# Patient Record
Sex: Female | Born: 2000 | Race: White | Hispanic: No | Marital: Single | Smoking: Never smoker
Health system: Southern US, Community
[De-identification: ages and names within clinical notes are randomized; demographics above are authoritative.]

---
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
## 2022-03-25 NOTE — Progress Notes (Deleted)
21 y.o. No obstetric history on file. Single Caucasian female here for annual exam.    PCP:     No LMP recorded.           Sexually active: {yes no:314532}  The current method of family planning is {contraception:315051}.    Exercising: {yes no:314532}  {types:19826} Smoker:  {YES NO:22349}  Health Maintenance: Pap: n/a History of abnormal Pap: n/a MMG: n/a Colonoscopy: n/a BMD: n/a  Result n/a TDaP:  *** Gardasil:   {YES NO:22349} HIV: Hep C: Screening Labs:  Hb today: ***, Urine today: ***     No past medical history on file.  No past surgical history on file.  No current outpatient medications on file.   No current facility-administered medications for this visit.    No family history on file.  Review of Systems  Exam:   There were no vitals taken for this visit.    General appearance: alert, cooperative and appears stated age Head: normocephalic, without obvious abnormality, atraumatic Neck: no adenopathy, supple, symmetrical, trachea midline and thyroid normal to inspection and palpation Lungs: clear to auscultation bilaterally Breasts: normal appearance, no masses or tenderness, No nipple retraction or dimpling, No nipple discharge or bleeding, No axillary adenopathy Heart: regular rate and rhythm Abdomen: soft, non-tender; no masses, no organomegaly Extremities: extremities normal, atraumatic, no cyanosis or edema Skin: skin color, texture, turgor normal. No rashes or lesions Lymph nodes: cervical, supraclavicular, and axillary nodes normal. Neurologic: grossly normal  Pelvic: External genitalia:  no lesions              No abnormal inguinal nodes palpated.              Urethra:  normal appearing urethra with no masses, tenderness or lesions              Bartholins and Skenes: normal                 Vagina: normal appearing vagina with normal color and discharge, no lesions              Cervix: no lesions              Pap taken: {yes  no:314532} Bimanual Exam:  Uterus:  normal size, contour, position, consistency, mobility, non-tender              Adnexa: no mass, fullness, tenderness              Rectal exam: {yes no:314532}.  Confirms.              Anus:  normal sphincter tone, no lesions  Chaperone was present for exam:  ***  Assessment:   Well woman visit with gynecologic exam.   Plan: Mammogram screening discussed. Self breast awareness reviewed. Pap and HR HPV as above. Guidelines for Calcium, Vitamin D, regular exercise program including cardiovascular and weight bearing exercise.   Follow up annually and prn.   Additional counseling given.  {yes [id number]. _______ minutes face to face time of which over 50% was spent in counseling.    After visit summary provided.

## 2022-04-01 ENCOUNTER — Encounter: Payer: Self-pay | Admitting: Obstetrics and Gynecology

## 2022-04-01 DIAGNOSIS — Z0289 Encounter for other administrative examinations: Secondary | ICD-10-CM

## 2022-04-16 DIAGNOSIS — K2100 Gastro-esophageal reflux disease with esophagitis, without bleeding: Secondary | ICD-10-CM | POA: Insufficient documentation

## 2022-06-02 ENCOUNTER — Other Ambulatory Visit: Payer: Self-pay | Admitting: Gastroenterology

## 2022-06-02 DIAGNOSIS — R142 Eructation: Secondary | ICD-10-CM

## 2022-06-02 DIAGNOSIS — K2100 Gastro-esophageal reflux disease with esophagitis, without bleeding: Secondary | ICD-10-CM

## 2022-06-12 ENCOUNTER — Ambulatory Visit
Admission: RE | Admit: 2022-06-12 | Discharge: 2022-06-12 | Disposition: A | Discharge status: Home / Self Care | Payer: BC Managed Care – PPO | Source: Ambulatory Visit | Attending: Gastroenterology | Admitting: Gastroenterology

## 2022-06-12 DIAGNOSIS — R142 Eructation: Secondary | ICD-10-CM

## 2022-06-12 DIAGNOSIS — K2100 Gastro-esophageal reflux disease with esophagitis, without bleeding: Secondary | ICD-10-CM

## 2022-06-19 ENCOUNTER — Encounter: Payer: Self-pay | Admitting: Radiology

## 2022-06-22 ENCOUNTER — Ambulatory Visit
Admission: EM | Admit: 2022-06-22 | Discharge: 2022-06-22 | Disposition: A | Discharge status: Home / Self Care | Payer: BC Managed Care – PPO

## 2022-06-22 DIAGNOSIS — J0100 Acute maxillary sinusitis, unspecified: Secondary | ICD-10-CM | POA: Diagnosis not present

## 2022-06-22 DIAGNOSIS — K122 Cellulitis and abscess of mouth: Secondary | ICD-10-CM

## 2022-06-22 MED ORDER — AMOXICILLIN-POT CLAVULANATE 875-125 MG PO TABS: 1.0000 | ORAL_TABLET | Freq: Two times a day (BID) | ORAL | 0 refills | Status: AC

## 2022-06-22 NOTE — Discharge Instructions (Addendum)
Advised/encouraged patient to take medication as directed with food to completion.  Encouraged patient increase daily water intake while taking this medication.  Advised patient if symptoms worsen and/or unresolved please follow-up with PCP or here for further evaluation.

## 2022-06-22 NOTE — ED Triage Notes (Addendum)
Pt c/o sore throat and cough x 2 weeks. Started to get a cold before leaving from trip to [place]. Denies fever. No hx of sinus infections. COVID test at home negative today. Tylenol and ibuprofen prn.

## 2022-06-22 NOTE — ED Provider Notes (Signed)
[facility]    CSN: [id number] Arrival date & time: 06/22/22  1252      History   Chief Complaint Chief Complaint  Patient presents with   Cough   Sore Throat    HPI Theresa Montoya is a 21 y.o. female.   HPI 21 year old female presents with cough and sore throat for 2 weeks.  Patient reports negative home COVID-19 test this morning.  History reviewed. No pertinent past medical history.  There are no problems to display for this patient.   History reviewed. No pertinent surgical history.  OB History   No obstetric history on file.      Home Medications    Prior to Admission medications   Medication Sig Start Date End Date Taking? Authorizing Provider  amoxicillin-clavulanate (AUGMENTIN) 875-125 MG tablet Take 1 tablet by mouth 2 (two) times daily for 7 days. 06/22/22 06/29/22 Yes Trevor Iha, FNP  pantoprazole (PROTONIX) 40 MG tablet 1 tablet 30 mins before morning meal 06/02/22  Yes [provider]  benzoyl peroxide (CERAVE ACNE FOAMING CREAM) 4 % external liquid 1 application    [provider]  Benzoyl Peroxide (PANOXYL FOAMING WASH) 10 % LIQD 1 application 10/08/21   [provider]  SPRINTEC 28 0.25-35 MG-MCG tablet Take 1 tablet by mouth daily. 05/05/22   [provider]  tretinoin (RETIN-A) 0.025 % cream 1 application to affected area in the evening to face 07/21/18   [provider]  tretinoin (RETIN-A) 0.1 % cream 1 application in the evening to face    [provider]    Family History History reviewed. No pertinent family history.  Social History Social History   Tobacco Use   Smoking status: Never   Smokeless tobacco: Never     Allergies   Patient has no known allergies.   Review of Systems Review of Systems  HENT:  Positive for congestion and sore throat.   Respiratory:  Positive for cough.   All other systems reviewed and are negative.    Physical Exam Triage Vital  Signs ED Triage Vitals  Enc Vitals Group     BP 06/22/22 1302 103/68     Pulse Rate 06/22/22 1302 64     Resp 06/22/22 1302 18     Temp 06/22/22 1302 98.9 F (37.2 C)     Temp Source 06/22/22 1302 Oral     SpO2 06/22/22 1302 99 %     Weight --      Height --      Head Circumference --      Peak Flow --      Pain Score 06/22/22 1303 0     Pain Loc --      Pain Edu? --      Excl. in GC? --    No data found.  Updated Vital Signs BP 103/68 (BP Location: Right Arm)   Pulse 64   Temp 98.9 F (37.2 C) (Oral)   Resp 18   SpO2 99%     Physical Exam Vitals and nursing note reviewed.  Constitutional:      Appearance: She is well-developed and normal weight.  HENT:     Head: Normocephalic and atraumatic.     Right Ear: Tympanic membrane and external ear normal.     Left Ear: Tympanic membrane and external ear normal.     Ears:     Comments: Moderate eustachian tube dysfunction noted bilaterally    Mouth/Throat:     Mouth: Mucous  membranes are moist.     Pharynx: Uvula midline. Posterior oropharyngeal erythema and uvula swelling present. No pharyngeal swelling.  Eyes:     Conjunctiva/sclera: Conjunctivae normal.     Pupils: Pupils are equal, round, and reactive to light.  Cardiovascular:     Rate and Rhythm: Normal rate and regular rhythm.     Heart sounds: Normal heart sounds. No murmur heard. Pulmonary:     Effort: Pulmonary effort is normal.     Breath sounds: Normal breath sounds. No wheezing, rhonchi or rales.  Musculoskeletal:     Cervical back: Normal range of motion and neck supple.  Skin:    General: Skin is warm and dry.  Neurological:     General: No focal deficit present.     Mental Status: She is alert and oriented to person, place, and time.      UC Treatments / Results  Labs (all labs ordered are listed, but only abnormal results are displayed) Labs Reviewed - No data to display  EKG   Radiology No results found.  Procedures Procedures  (including critical care time)  Medications Ordered in UC Medications - No data to display  Initial Impression / Assessment and Plan / UC Course  I have reviewed the triage vital signs and the nursing notes.  Pertinent labs & imaging results that were available during my care of the patient were reviewed by me and considered in my medical decision making (see chart for details).     MDM: 1.  Uvulitis-Rx'd Augmentin; 2.  Subacute maxillary sinusitis-Rx'd Augmentin. Advised/encouraged patient to take medication as directed with food to completion.  Encouraged patient increase daily water intake while taking this medication.  Advised patient if symptoms worsen and/or unresolved please follow-up with PCP or here for further evaluation.  Work note provided to patient prior to discharge.  Patient discharged home, hemodynamically stable. Final Clinical Impressions(s) / UC Diagnoses   Final diagnoses:  Uvulitis  Subacute maxillary sinusitis     Discharge Instructions      Advised/encouraged patient to take medication as directed with food to completion.  Encouraged patient increase daily water intake while taking this medication.  Advised patient if symptoms worsen and/or unresolved please follow-up with PCP or here for further evaluation.     ED Prescriptions     Medication Sig Dispense Auth. Provider   amoxicillin-clavulanate (AUGMENTIN) 875-125 MG tablet Take 1 tablet by mouth 2 (two) times daily for 7 days. 14 tablet Trevor Iha, FNP      PDMP not reviewed this encounter.   Trevor Iha, FNP 06/22/22 1358

## 2022-06-30 ENCOUNTER — Ambulatory Visit (INDEPENDENT_AMBULATORY_CARE_PROVIDER_SITE_OTHER): Payer: BC Managed Care – PPO | Admitting: Radiology

## 2022-06-30 ENCOUNTER — Encounter: Payer: Self-pay | Admitting: Radiology

## 2022-06-30 VITALS — BP 98/62 | Ht 65.0 in | Wt 117.0 lb

## 2022-06-30 DIAGNOSIS — L700 Acne vulgaris: Secondary | ICD-10-CM | POA: Diagnosis not present

## 2022-06-30 DIAGNOSIS — Z01419 Encounter for gynecological examination (general) (routine) without abnormal findings: Secondary | ICD-10-CM | POA: Diagnosis not present

## 2022-06-30 DIAGNOSIS — R142 Eructation: Secondary | ICD-10-CM | POA: Diagnosis not present

## 2022-06-30 MED ORDER — NORGESTIM-ETH ESTRAD TRIPHASIC 0.18/0.215/0.25 MG-25 MCG PO TABS: 1.0000 | ORAL_TABLET | Freq: Every day | ORAL | 1 refills | Status: DC

## 2022-06-30 NOTE — Progress Notes (Signed)
   Theresa Montoya 04-Mar-2001 [id number]   History:  21 y.o. presents as a new patient for annual exam. Has been struggling with gas, bloating and belching the week before her period. No diarrhea or GI sx. Being worked up by GI currently, planning to test for H. Pylori. She was started on pantoprazole and has noticed heartburn has improved.   Gynecologic History Patient's last menstrual period was 06/03/2022 (exact date). Period Cycle (Days): 28 Period Duration (Days): 7 Period Pattern: Regular Menstrual Flow: Light Menstrual Control: Thin pad, Tampon Dysmenorrhea: (!) Mild Dysmenorrhea Symptoms: Cramping Contraception/Family planning: abstinence and OCP (estrogen/progesterone) Sexually active: no  Obstetric History OB History  Gravida Para Term Preterm AB Living  0 0 0 0 0 0  SAB IAB Ectopic Multiple Live Births  0 0 0 0 0     The following portions of the patient's history were reviewed and updated as appropriate: allergies, current medications, past family history, past medical history, past social history, past surgical history, and problem list.  Review of Systems Pertinent items noted in HPI and remainder of comprehensive ROS otherwise negative.   Past medical history, past surgical history, family history and social history were all reviewed and documented in the EPIC chart.   Exam:  Vitals:   06/30/22 0942  BP: 98/62  Weight: 117 lb (53.1 kg)  Height: 5\' 5"  (1.651 m)   Body mass index is 19.47 kg/m.  General appearance:  Normal Thyroid:  Symmetrical, normal in size, without palpable masses or nodularity. Respiratory  Auscultation:  Clear without wheezing or rhonchi Cardiovascular  Auscultation:  Regular rate, without rubs, murmurs or gallops  Edema/varicosities:  Not grossly evident Abdominal  Soft,nontender, without masses, guarding or rebound.  Liver/spleen:  No organomegaly noted  Hernia:  None appreciated  Skin  Inspection: acne vulgaris face and  chest Breasts: Examined lying and sitting.   Right: Without masses, retractions, nipple discharge or axillary adenopathy.   Left: Without masses, retractions, nipple discharge or axillary adenopathy. Genitourinary             deferred   Patient informed chaperone available to be present for breast and pelvic exam. Patient has requested no chaperone to be present. Patient has been advised what will be completed during breast and pelvic exam.   Assessment/Plan:   1. Well woman exam with routine gynecological exam Pap at 23  2. Acne vulgaris Switch to Tri Lo Sprintec, skip placebo pills  3. Belching Continue pantoprazole Begin Garden of Life Women's probiotic 1/4 tsp baking soda to 4oz water orally daily before lunch or dinner prn     Discussed SBE, colonoscopy and DEXA screening as directed/appropriate. Recommend of exercise weekly, including weight bearing exercise. Encouraged the use of seatbelts and sunscreen. Return in 1 year for annual or as needed.   B WHNP-BC 10:19 AM 06/30/2022

## 2022-10-07 ENCOUNTER — Ambulatory Visit: Payer: BC Managed Care – PPO | Admitting: Radiology

## 2022-10-07 VITALS — BP 102/62 | Ht 65.0 in

## 2022-10-07 DIAGNOSIS — Z113 Encounter for screening for infections with a predominantly sexual mode of transmission: Secondary | ICD-10-CM

## 2022-10-07 DIAGNOSIS — Z3041 Encounter for surveillance of contraceptive pills: Secondary | ICD-10-CM | POA: Diagnosis not present

## 2022-10-07 DIAGNOSIS — Z7251 High risk heterosexual behavior: Secondary | ICD-10-CM | POA: Diagnosis not present

## 2022-10-07 MED ORDER — DROSPIRENONE-ETHINYL ESTRADIOL 3-0.02 MG PO TABS: 1.0000 | ORAL_TABLET | Freq: Every day | ORAL | 2 refills | Status: DC

## 2022-10-07 NOTE — Progress Notes (Signed)
   Theresa Montoya 08-01-2001 [id number]   History:  21 y.o. here for OCP f/u, Tri Sprintec did not improver her acne and she feels more emotional with it. Recently became sexually active would like STI screening today, condom came off during one encounter.  Gynecologic History Patient's last menstrual period was 09/10/2022 (exact date).   Contraception/Family planning: condoms and OCP (estrogen/progesterone) Sexually active: yes  Obstetric History OB History  Gravida Para Term Preterm AB Living  0 0 0 0 0 0  SAB IAB Ectopic Multiple Live Births  0 0 0 0 0     The following portions of the patient's history were reviewed and updated as appropriate: allergies, current medications, past family history, past medical history, past social history, past surgical history, and problem list.  Review of Systems Pertinent items noted in HPI and remainder of comprehensive ROS otherwise negative.   Past medical history, past surgical history, family history and social history were all reviewed and documented in the EPIC chart.   Exam:  Vitals:   10/07/22 1323  BP: 102/62  Height: 5\' 5"  (1.651 m)   Body mass index is 19.47 kg/m.  General appearance:  Normal Genitourinary   Inguinal/mons:  Normal without inguinal adenopathy  External genitalia:  Normal appearing vulva with no masses, tenderness, or lesions  BUS/Urethra/Skene's glands:  Normal without masses or exudate  Vagina:  Normal appearing with normal color and discharge, no lesions  Cervix:  Normal appearing without discharge or lesions  Uterus:  Normal in size, shape and contour.  Mobile, nontender  Adnexa/parametria:     Rt: Normal in size, without masses or tenderness.   Lt: Normal in size, without masses or tenderness.  Anus and perineum: Normal   Patient informed chaperone available to be present for breast and pelvic exam. Patient has requested no chaperone to be present. Patient has been advised what will be completed  during breast and pelvic exam.   Assessment/Plan:   1. Screen for STD (sexually transmitted disease) - Safe sex encouraged  - SURESWAB CT/NG/T. vaginalis  2. Encounter for surveillance of contraceptive pills Will switch to Yaz to improve acne and PMS symptoms  - drospirenone-ethinyl estradiol (YAZ) 3-0.02 MG tablet; Take 1 tablet by mouth daily.  Dispense: 84 tablet; Refill: 2     Discussed SBE, colonoscopy and DEXA screening as directed/appropriate. Recommend 12-03-1996 of exercise weekly, including weight bearing exercise. Encouraged the use of seatbelts and sunscreen. Return in 1 year for annual or as needed.   B WHNP-BC 1:48 PM 10/07/2022

## 2022-10-08 LAB — SURESWAB CT/NG/T. VAGINALIS
C. trachomatis RNA, TMA: NOT DETECTED
N. gonorrhoeae RNA, TMA: NOT DETECTED
Trichomonas vaginalis RNA: NOT DETECTED

## 2022-11-04 ENCOUNTER — Ambulatory Visit: Payer: Self-pay | Admitting: Internal Medicine

## 2022-11-05 ENCOUNTER — Encounter: Payer: Self-pay | Admitting: Internal Medicine

## 2022-11-05 ENCOUNTER — Ambulatory Visit: Payer: BC Managed Care – PPO | Admitting: Internal Medicine

## 2022-11-05 VITALS — BP 100/64 | HR 59 | Temp 97.7°F | Ht 65.0 in | Wt 122.0 lb

## 2022-11-05 DIAGNOSIS — Z0000 Encounter for general adult medical examination without abnormal findings: Secondary | ICD-10-CM

## 2022-11-05 NOTE — Assessment & Plan Note (Signed)
Flu shot up to date. Tetanus getting records likely up to date. HPV checking records. Counseled about sun safety and mole surveillance. Counseled about tobacco and drug and alcohol avoidance Counseled about the dangers of distracted driving. Given 10 year screening recommendations.

## 2022-11-05 NOTE — Progress Notes (Signed)
   Subjective:   Patient ID: Theresa Montoya, female    DOB: 11-25-2000, 21 y.o.   MRN: [id number]  HPI The patient is a 21 YO new patient coming in for physical.  PMH, FMH, social history reviewed and updated  Review of Systems  Constitutional: Negative.   HENT: Negative.    Eyes: Negative.   Respiratory:  Negative for cough, chest tightness and shortness of breath.   Cardiovascular:  Negative for chest pain, palpitations and leg swelling.  Gastrointestinal:  Negative for abdominal distention, abdominal pain, constipation, diarrhea, nausea and vomiting.  Musculoskeletal: Negative.   Skin: Negative.   Neurological: Negative.   Psychiatric/Behavioral: Negative.      Objective:  Physical Exam Constitutional:      Appearance: She is well-developed.  HENT:     Head: Normocephalic and atraumatic.  Cardiovascular:     Rate and Rhythm: Normal rate and regular rhythm.  Pulmonary:     Effort: Pulmonary effort is normal. No respiratory distress.     Breath sounds: Normal breath sounds. No wheezing or rales.  Abdominal:     General: Bowel sounds are normal. There is no distension.     Palpations: Abdomen is soft.     Tenderness: There is no abdominal tenderness. There is no rebound.  Musculoskeletal:     Cervical back: Normal range of motion.  Skin:    General: Skin is warm and dry.  Neurological:     Mental Status: She is alert and oriented to person, place, and time.     Coordination: Coordination normal.     Vitals:   11/05/22 0842  BP: 100/64  Pulse: (!) 59  Temp: 97.7 F (36.5 C)  TempSrc: Oral  SpO2: 99%  Weight: 122 lb (55.3 kg)  Height: 5\' 5"  (1.651 m)    Assessment & Plan:

## 2023-01-29 ENCOUNTER — Ambulatory Visit: Payer: BC Managed Care – PPO | Admitting: Family Medicine

## 2023-06-02 DIAGNOSIS — Z3041 Encounter for surveillance of contraceptive pills: Secondary | ICD-10-CM

## 2023-06-03 NOTE — Telephone Encounter (Signed)
Msg sent to appt desk.  

## 2023-06-04 MED ORDER — DROSPIRENONE-ETHINYL ESTRADIOL 3-0.02 MG PO TABS: 1.0000 | ORAL_TABLET | Freq: Every day | ORAL | 0 refills | Status: DC

## 2023-06-04 NOTE — Telephone Encounter (Signed)
AEX now scheduled for 07/23/2023.   Rx pend.

## 2023-07-23 ENCOUNTER — Ambulatory Visit: Payer: BC Managed Care – PPO | Admitting: Radiology

## 2023-11-02 ENCOUNTER — Encounter: Payer: Self-pay | Admitting: Radiology

## 2023-11-02 ENCOUNTER — Ambulatory Visit (INDEPENDENT_AMBULATORY_CARE_PROVIDER_SITE_OTHER): Payer: BC Managed Care – PPO | Admitting: Radiology

## 2023-11-02 VITALS — BP 94/56 | HR 65 | Ht 65.0 in | Wt 126.0 lb

## 2023-11-02 DIAGNOSIS — Z3041 Encounter for surveillance of contraceptive pills: Secondary | ICD-10-CM

## 2023-11-02 DIAGNOSIS — N760 Acute vaginitis: Secondary | ICD-10-CM

## 2023-11-02 DIAGNOSIS — Z01419 Encounter for gynecological examination (general) (routine) without abnormal findings: Secondary | ICD-10-CM | POA: Diagnosis not present

## 2023-11-02 DIAGNOSIS — Z113 Encounter for screening for infections with a predominantly sexual mode of transmission: Secondary | ICD-10-CM

## 2023-11-02 MED ORDER — JUNEL FE 24 1-20 MG-MCG(24) PO TABS: 1.0000 | ORAL_TABLET | Freq: Every day | ORAL | 4 refills | Status: AC

## 2023-11-02 NOTE — Patient Instructions (Signed)

## 2023-11-02 NOTE — Progress Notes (Signed)
   Theresa Montoya 2001/05/23 [id number]   History:  22 y.o. G0 presents for annual exam. Ran out of Yaz, has not taken in over a month. Noticed weight gain with that pill, would like to try another one. Would like STI screening. Also with recurrent vaginitis, recently treated for BV now with itching x 1 month. Sees derm for her acne. 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  Gynecologic History Patient's last menstrual period was 10/26/2023 (approximate). Period Cycle (Days): 28 Period Duration (Days): 5 Period Pattern: Regular Menstrual Flow: Light Menstrual Control: Thin pad, Maxi pad Dysmenorrhea: (!) Mild Dysmenorrhea Symptoms: Cramping Contraception/Family planning: condoms and OCP (estrogen/progesterone) Sexually active: yes   Obstetric History OB History  Gravida Para Term Preterm AB Living  0 0 0 0 0 0  SAB IAB Ectopic Multiple Live Births  0 0 0 0 0    The following portions of the patient's history were reviewed and updated as appropriate: allergies, current medications, past family history, past medical history, past social history, past surgical history, and problem list.  ROS  Past medical history, past surgical history, family history and social history were all reviewed and documented in the EPIC chart.  Exam:  Vitals:   11/02/23 1551  BP: (!) 94/56  Pulse: 65  SpO2: 92%  Weight: 126 lb (57.2 kg)  Height: 5\' 5"  (1.651 m)   Body mass index is 20.97 kg/m.  Physical Exam   Raynelle Fanning, CMA present for exam  Assessment/Plan:   1. Well woman exam with routine gynecological exam (Primary) Pap at 23  2. Oral contraceptive pill surveillance - Norethindrone Acetate-Ethinyl Estrad-FE (JUNEL FE 24) 1-20 MG-MCG(24) tablet; Take 1 tablet by mouth daily.  Dispense: 84 tablet; Refill: 4  3. Recurrent vaginitis - SureSwab Advanced Vaginitis Plus,TMA  4. Screening for STDs (sexually transmitted diseases) - SureSwab Advanced Vaginitis Plus,TMA    Discussed SBE, pap and STI  screening as directed/appropriate. Recommend of exercise weekly, including weight bearing exercise. Encouraged the use of seatbelts and sunscreen.  Return in about 1 year (around 11/01/2024) for Annual.  Arlie Solomons B WHNP-BC 4:10 PM 11/02/2023

## 2023-11-03 LAB — SURESWAB® ADVANCED VAGINITIS PLUS,TMA
C. trachomatis RNA, TMA: NOT DETECTED
CANDIDA SPECIES: NOT DETECTED
Candida glabrata: NOT DETECTED
N. gonorrhoeae RNA, TMA: NOT DETECTED
SURESWAB(R) ADV BACTERIAL VAGINOSIS(BV),TMA: NEGATIVE
TRICHOMONAS VAGINALIS (TV),TMA: NOT DETECTED

## 2024-05-11 ENCOUNTER — Encounter: Payer: Self-pay | Admitting: Radiology

## 2024-05-11 ENCOUNTER — Ambulatory Visit: Payer: Self-pay | Admitting: Radiology

## 2024-05-11 VITALS — BP 100/62 | HR 57 | Wt 125.0 lb

## 2024-05-11 DIAGNOSIS — A630 Anogenital (venereal) warts: Secondary | ICD-10-CM | POA: Diagnosis not present

## 2024-05-11 NOTE — Progress Notes (Signed)
      Subjective: Theresa Montoya is a 23 y.o. female who complains of vulvar irritation and skin tags on her labia she noticed 3 weeks ago.    Review of Systems  All other systems reviewed and are negative.   No past medical history on file.    Objective:  Today's Vitals   05/11/24 1450  BP: 100/62  Pulse: (!) 57  SpO2: 93%  Weight: 125 lb (56.7 kg)   Body mass index is 20.8 kg/m.   Physical Exam Vitals and nursing note reviewed. Exam conducted with a chaperone present.  Constitutional:      Appearance: Normal appearance. She is well-developed.  Pulmonary:     Effort: Pulmonary effort is normal.  Abdominal:     General: Abdomen is flat.     Palpations: Abdomen is soft.  Genitourinary:    General: Normal vulva.     Labia:        Right: Lesion present.        Left: Lesion present.      Vagina: No vaginal discharge, erythema, bleeding or lesions.     Cervix: Normal. No discharge, friability, lesion or erythema.     Uterus: Normal.      Adnexa: Right adnexa normal and left adnexa normal.      Comments: Condyloma present on inner labia bilaterally. Offered aldara or cryo in office, would like to try cryo. Consent obtained. Time out performed. Risks and benefits discussed.Vercuca freeze applied to both affected areas and held for 30seconds each. Patient tolerated well.  Neurological:     Mental Status: She is alert.   Psychiatric:        Mood and Affect: Mood normal.        Thought Content: Thought content normal.        Judgment: Judgment normal.     Darice Hoit, CMA present for exam  Assessment:/Plan:  1. Condyloma (Primary) Treated with Veruca freeze, tolerated well After care reviewed Follow up as needed Confirm she has had the HPV vaccine   Chelsey Redondo B, NP 3:04 PM

## 2024-06-01 ENCOUNTER — Other Ambulatory Visit: Payer: Self-pay | Admitting: Internal Medicine

## 2024-06-01 DIAGNOSIS — R140 Abdominal distension (gaseous): Secondary | ICD-10-CM

## 2024-06-01 DIAGNOSIS — R109 Unspecified abdominal pain: Secondary | ICD-10-CM

## 2024-06-07 ENCOUNTER — Encounter: Payer: Self-pay | Admitting: Internal Medicine

## 2024-06-12 ENCOUNTER — Ambulatory Visit: Payer: Self-pay

## 2024-06-12 ENCOUNTER — Ambulatory Visit
Admission: RE | Admit: 2024-06-12 | Discharge: 2024-06-12 | Disposition: A | Discharge status: Home / Self Care | Source: Ambulatory Visit | Attending: Internal Medicine

## 2024-06-12 DIAGNOSIS — R109 Unspecified abdominal pain: Secondary | ICD-10-CM

## 2024-06-12 DIAGNOSIS — R140 Abdominal distension (gaseous): Secondary | ICD-10-CM

## 2024-06-12 NOTE — Telephone Encounter (Signed)
 FYI Only or Action Required?: FYI only for provider.  Patient was last seen in primary care on 11/05/2022 by Rollene Almarie LABOR, MD.  Called Nurse Triage reporting No chief complaint on file..  Symptoms began several months ago.  Interventions attempted: Nothing.  Symptoms are: unchanged.  Triage Disposition: See Physician Within 24 Hours  Patient/caregiver understands and will follow disposition?: Yes   Copied from CRM #[id number]. Topic: Clinical - Red Word Triage >> Jun 12, 2024  3:09 PM Jayma L wrote: Red Word that prompted transfer to Nurse Triage: patient called in asking for labs to be added to system for her appt for the yearly physical tomorrow. Said she's been extremely fatigue recently and wants to be checked. Also having issues with bloating and gas Reason for Disposition  [1] MILD pain (e.g., does not interfere with normal activities) AND [2] pain comes and goes (cramps) AND [3] present > 48 hours  (Exception: This same abdominal pain is a chronic symptom recurrent or ongoing AND present > 4 weeks.)  Answer Assessment - Initial Assessment Questions 1. LOCATION: Where does it hurt?      Generalized  2. RADIATION: Does the pain shoot anywhere else? (e.g., chest, back)     No  3. ONSET: When did the pain begin? (e.g., minutes, hours or days ago)      Chronic  4. SUDDEN: Gradual or sudden onset?     Gradual  5. PATTERN Does the pain come and go, or is it constant?     Constant  6. SEVERITY: How bad is the pain?  (e.g., Scale 1-10; mild, moderate, or severe)     Mild  7. RECURRENT SYMPTOM: Have you ever had this type of stomach pain before? If Yes, ask: When was the last time? and What happened that time?      Yes  8. CAUSE: What do you think is causing the stomach pain? (e.g., gallstones, recent abdominal surgery)     Gallbladder-related  9. RELIEVING/AGGRAVATING FACTORS: What makes it better or worse? (e.g., antacids, bending or twisting  motion, bowel movement)      None noted  10. OTHER SYMPTOMS: Do you have any other symptoms? (e.g., back pain, diarrhea, fever, urination pain, vomiting)       Flatulence, Bloating  11. PREGNANCY: Is there any chance you are pregnant? When was your last menstrual period?       No longer on birth control, LMP 2 Weeks ago  Protocols used: Abdominal Pain - Female-A-AH

## 2024-06-13 ENCOUNTER — Encounter: Payer: Self-pay | Admitting: Internal Medicine

## 2024-06-13 ENCOUNTER — Ambulatory Visit (INDEPENDENT_AMBULATORY_CARE_PROVIDER_SITE_OTHER): Payer: Self-pay | Admitting: Internal Medicine

## 2024-06-13 VITALS — BP 80/60 | HR 46 | Temp 98.6°F | Ht 65.0 in | Wt 124.0 lb

## 2024-06-13 DIAGNOSIS — L709 Acne, unspecified: Secondary | ICD-10-CM

## 2024-06-13 DIAGNOSIS — R5383 Other fatigue: Secondary | ICD-10-CM | POA: Insufficient documentation

## 2024-06-13 DIAGNOSIS — Z0000 Encounter for general adult medical examination without abnormal findings: Secondary | ICD-10-CM

## 2024-06-13 LAB — COMPREHENSIVE METABOLIC PANEL WITH GFR
ALT: 25 U/L (ref 0–35)
AST: 21 U/L (ref 0–37)
Albumin: 4.4 g/dL (ref 3.5–5.2)
Alkaline Phosphatase: 50 U/L (ref 39–117)
BUN: 13 mg/dL (ref 6–23)
CO2: 26 meq/L (ref 19–32)
Calcium: 9.7 mg/dL (ref 8.4–10.5)
Chloride: 103 meq/L (ref 96–112)
Creatinine, Ser: 0.73 mg/dL (ref 0.40–1.20)
GFR: 117.91 mL/min (ref >60.00)
Glucose, Bld: 86 mg/dL (ref 70–99)
Potassium: 4 meq/L (ref 3.5–5.1)
Sodium: 139 meq/L (ref 135–145)
Total Bilirubin: 0.4 mg/dL (ref 0.2–1.2)
Total Protein: 7 g/dL (ref 6.0–8.3)

## 2024-06-13 LAB — LIPID PANEL
Cholesterol: 149 mg/dL (ref 0–200)
HDL: 58.3 mg/dL (ref >39.00)
LDL Cholesterol: 78 mg/dL (ref 0–99)
NonHDL: 91
Total CHOL/HDL Ratio: 3
Triglycerides: 64 mg/dL (ref 0.0–149.0)
VLDL: 12.8 mg/dL (ref 0.0–40.0)

## 2024-06-13 LAB — VITAMIN D 25 HYDROXY (VIT D DEFICIENCY, FRACTURES): VITD: 40.17 ng/mL (ref 30.00–100.00)

## 2024-06-13 LAB — CBC
HCT: 42.2 % (ref 36.0–46.0)
Hemoglobin: 14.1 g/dL (ref 12.0–15.0)
MCHC: 33.5 g/dL (ref 30.0–36.0)
MCV: 84.7 fl (ref 78.0–100.0)
Platelets: 296 K/uL (ref 150.0–400.0)
RBC: 4.98 Mil/uL (ref 3.87–5.11)
RDW: 13 % (ref 11.5–15.5)
WBC: 5.7 K/uL (ref 4.0–10.5)

## 2024-06-13 LAB — TSH: TSH: 2.03 u[IU]/mL (ref 0.35–5.50)

## 2024-06-13 LAB — VITAMIN B12: Vitamin B-12: 654 pg/mL (ref 211–911)

## 2024-06-13 NOTE — Assessment & Plan Note (Signed)
 Flu shot yearly. Tetanus counseled likely up to date. Pap smear up to date with gyn. Counseled about sun safety and mole surveillance. Counseled about the dangers of distracted driving. Given 10 year screening recommendations.

## 2024-06-13 NOTE — Progress Notes (Signed)
   Subjective:   Patient ID: Theresa Montoya, female    DOB: 10-21-2001, 23 y.o.   MRN: [id number]  HPI The patient is here for physical. With some new fatigue. Sleeping enough but not rested and can function but very tired as soon as done with tasks or work.  PMH, [facility], social history reviewed and updated  Review of Systems  Constitutional:  Positive for fatigue.  HENT: Negative.    Eyes: Negative.   Respiratory:  Negative for cough, chest tightness and shortness of breath.   Cardiovascular:  Negative for chest pain, palpitations and leg swelling.  Gastrointestinal:  Negative for abdominal distention, abdominal pain, constipation, diarrhea, nausea and vomiting.  Musculoskeletal: Negative.   Skin: Negative.   Neurological: Negative.   Psychiatric/Behavioral: Negative.      Objective:  Physical Exam Constitutional:      Appearance: She is well-developed.  HENT:     Head: Normocephalic and atraumatic.  Cardiovascular:     Rate and Rhythm: Normal rate and regular rhythm.  Pulmonary:     Effort: Pulmonary effort is normal. No respiratory distress.     Breath sounds: Normal breath sounds. No wheezing or rales.  Abdominal:     General: Bowel sounds are normal. There is no distension.     Palpations: Abdomen is soft.     Tenderness: There is no abdominal tenderness. There is no rebound.  Musculoskeletal:     Cervical back: Normal range of motion.  Skin:    General: Skin is warm and dry.  Neurological:     Mental Status: She is alert and oriented to person, place, and time.     Coordination: Coordination normal.     Vitals:   06/13/24 1038  BP: (!) 80/60  Pulse: (!) 46  Temp: 98.6 F (37 C)  TempSrc: Oral  SpO2: 98%  Weight: 124 lb (56.2 kg)  Height: 5' 5 (1.651 m)    Assessment & Plan:

## 2024-06-13 NOTE — Assessment & Plan Note (Signed)
 Checking TSH and vitamin D  and B12 and CBC and cMP. Adjust as needed.

## 2024-06-13 NOTE — Assessment & Plan Note (Signed)
 Using cream and satisfied with control.

## 2024-06-14 ENCOUNTER — Ambulatory Visit: Payer: Self-pay | Admitting: Internal Medicine

## 2024-07-05 NOTE — Telephone Encounter (Signed)
 Call placed to patient, left message advising OV recommended, call [phone], opt 1 for appts.

## 2024-07-05 NOTE — Telephone Encounter (Signed)
 Needs OV.

## 2024-07-12 ENCOUNTER — Encounter: Payer: Self-pay | Admitting: Radiology

## 2024-07-12 ENCOUNTER — Ambulatory Visit: Admitting: Radiology

## 2024-07-12 VITALS — BP 102/60 | HR 66 | Wt 125.0 lb

## 2024-07-12 DIAGNOSIS — L292 Pruritus vulvae: Secondary | ICD-10-CM

## 2024-07-12 MED ORDER — TRIAMCINOLONE ACETONIDE 0.5 % EX OINT: 1.0000 | TOPICAL_OINTMENT | Freq: Two times a day (BID) | CUTANEOUS | 0 refills | Status: AC

## 2024-07-12 NOTE — Progress Notes (Signed)
      Subjective: Theresa Montoya is a 23 y.o. 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 ? Of bumps on lower labia with some itching.   Review of Systems  All other systems reviewed and are negative.   No past medical history on file.    Objective:  Today's Vitals   07/12/24 0825  BP: 102/60  Pulse: 66  SpO2: 99%  Weight: 125 lb (56.7 kg)   Body mass index is 20.8 kg/m.   Physical Exam Vitals and nursing note reviewed. Exam conducted with a chaperone present.  Constitutional:      Appearance: Normal appearance. She is well-developed.  Pulmonary:     Effort: Pulmonary effort is normal.  Abdominal:     General: Abdomen is flat.     Palpations: Abdomen is soft.  Genitourinary:    General: Normal vulva.     Vagina: Vaginal discharge present. No erythema, bleeding or lesions.     Cervix: Normal. No discharge, friability, lesion or erythema.     Uterus: Normal.      Adnexa: Right adnexa normal and left adnexa normal.      Comments: Slight erythema. No condyloma seen on exam. Neurological:     Mental Status: She is alert.  Psychiatric:        Mood and Affect: Mood normal.        Thought Content: Thought content normal.        Judgment: Judgment normal.      Darice Hoit, CMA present for exam  Assessment:/Plan:  1. Vulvar itching (Primary)  - triamcinolone  ointment (KENALOG ) 0.5 %; Apply 1 Application topically 2 (two) times daily.  Dispense: 30 g; Refill: 0    Avoid intercourse until symptoms are resolved. Safe sex encouraged. Avoid the use of soaps or perfumed products in the peri area. Avoid tub baths and sitting in sweaty or wet clothing for prolonged periods of time.     Verdis Koval B, NP 8:38 AM

## 2024-11-02 ENCOUNTER — Ambulatory Visit: Admitting: Radiology

## 2024-11-02 ENCOUNTER — Other Ambulatory Visit (HOSPITAL_COMMUNITY)
Admission: RE | Admit: 2024-11-02 | Discharge: 2024-11-02 | Disposition: A | Discharge status: Home / Self Care | Source: Ambulatory Visit | Attending: Radiology | Admitting: Radiology

## 2024-11-02 ENCOUNTER — Encounter: Payer: Self-pay | Admitting: Radiology

## 2024-11-02 VITALS — BP 102/62 | HR 62 | Ht 64.5 in | Wt 122.0 lb

## 2024-11-02 DIAGNOSIS — Z113 Encounter for screening for infections with a predominantly sexual mode of transmission: Secondary | ICD-10-CM

## 2024-11-02 DIAGNOSIS — Z01419 Encounter for gynecological examination (general) (routine) without abnormal findings: Secondary | ICD-10-CM

## 2024-11-02 DIAGNOSIS — Z3009 Encounter for other general counseling and advice on contraception: Secondary | ICD-10-CM

## 2024-11-02 DIAGNOSIS — Z1331 Encounter for screening for depression: Secondary | ICD-10-CM

## 2024-11-02 MED ORDER — MISOPROSTOL 200 MCG PO TABS: 400.0000 ug | ORAL_TABLET | Freq: Once | ORAL | 0 refills | Status: AC

## 2024-11-02 NOTE — Progress Notes (Signed)
 Theresa Montoya [birth year] [id number]   History:  23 y.o. G0 presents for annual exam. Interested in an IUD for [facility]. Stopped Junel  because PMS was worse. Using condoms consistently.  Gynecologic History Patient's last menstrual period was 10/13/2024 (approximate). Period Cycle (Days): 28 Period Duration (Days): 5-7 Period Pattern: Regular Menstrual Flow: Light Menstrual Control: Maxi pad, Thin pad, Tampon Dysmenorrhea: None Contraception/Family planning: condoms Sexually active: yes Last Pap: never  Obstetric History OB History  Gravida Para Term Preterm AB Living  0 0 0 0 0 0  SAB IAB Ectopic Multiple Live Births  0 0 0 0 0       11/02/2024    8:02 AM 06/13/2024   10:41 AM 11/02/2023    3:54 PM  Depression screen PHQ 2/9  Decreased Interest 0 0 0  Down, Depressed, Hopeless 0 0 0  PHQ - 2 Score 0 0 0  Altered sleeping  2   Tired, decreased energy  2   Change in appetite  0   Feeling bad or failure about yourself   0   Trouble concentrating  0   Moving slowly or fidgety/restless  0   Suicidal thoughts  0   PHQ-9 Score  4    Difficult doing work/chores  Not difficult at all      Data saved with a previous flowsheet row definition     The following portions of the patient's history were reviewed and updated as appropriate: allergies, current medications, past family history, past medical history, past social history, past surgical history, and problem list.  Review of Systems  All other systems reviewed and are negative.   Past medical history, past surgical history, family history and social history were all reviewed and documented in the EPIC chart.  Exam:  Vitals:   11/02/24 0800  BP: 102/62  Pulse: 62  SpO2: 99%  Weight: 122 lb (55.3 kg)  Height: 5' 4.5 (1.638 m)   Body mass index is 20.62 kg/m.  Physical Exam Vitals and nursing note reviewed. Exam conducted with a chaperone present.  Constitutional:      Appearance: Normal appearance. She is  normal weight.  HENT:     Head: Normocephalic and atraumatic.  Neck:     Thyroid : No thyroid  mass, thyromegaly or thyroid  tenderness.  Cardiovascular:     Rate and Rhythm: Regular rhythm.     Heart sounds: Normal heart sounds.  Pulmonary:     Effort: Pulmonary effort is normal.     Breath sounds: Normal breath sounds.  Chest:  Breasts:    Breasts are symmetrical.     Right: Normal. No inverted nipple, mass, nipple discharge, skin change or tenderness.     Left: Normal. No inverted nipple, mass, nipple discharge, skin change or tenderness.  Abdominal:     General: Abdomen is flat. Bowel sounds are normal.     Palpations: Abdomen is soft.  Genitourinary:    General: Normal vulva.     Vagina: Normal. No vaginal discharge, bleeding or lesions.     Cervix: Normal. No discharge or lesion.     Uterus: Normal. Not enlarged and not tender.      Adnexa: Right adnexa normal and left adnexa normal.       Right: No mass, tenderness or fullness.         Left: No mass, tenderness or fullness.    Lymphadenopathy:     Upper Body:     Right upper body: No axillary adenopathy.  Left upper body: No axillary adenopathy.  Skin:    General: Skin is warm and dry.  Neurological:     Mental Status: She is alert and oriented to person, place, and time.  Psychiatric:        Mood and Affect: Mood normal.        Thought Content: Thought content normal.        Judgment: Judgment normal.      Darice Hoit, CMA present for exam  Assessment/Plan:   1. Well woman exam with routine gynecological exam (Primary) - Cytology - PAP( [place])  2. Screening for STDs (sexually transmitted diseases) - Cytology - PAP( [place])  3. Counseling for birth control regarding intrauterine device (IUD) - IUD Insertion; Future- counseling on all options, will plan for Kyleena - misoprostol  (CYTOTEC ) 200 MCG tablet; Place 2 tablets (400 mcg total) vaginally once. The night before procedure  Dispense: 2 tablet;  Refill: 0 - Ibuprofen 800mg  2 hours before appt  4. Depression screen negative    Return for Kyleena insertion 2-3 weeks with menses.  Edyn Qazi B WHNP-BC 8:16 AM 11/02/2024

## 2024-11-06 LAB — CYTOLOGY - PAP
Chlamydia: NEGATIVE
Comment: NEGATIVE
Comment: NEGATIVE
Comment: NORMAL
Diagnosis: NEGATIVE
Neisseria Gonorrhea: NEGATIVE
Trichomonas: NEGATIVE

## 2024-11-07 ENCOUNTER — Ambulatory Visit: Payer: Self-pay | Admitting: Radiology

## 2024-11-14 MED ORDER — FLUCONAZOLE 150 MG PO TABS: 150.0000 mg | ORAL_TABLET | Freq: Once | ORAL | 0 refills | Status: AC

## 2024-11-14 NOTE — Telephone Encounter (Signed)
 Patient notified of pap results. Diflucan sent to pharmacy. Patient also stated she wanted me to cancel her IUD insertion appt. She decided she does not want to do it.  Appt cancelled. Patient aware to call us  if she decides to take any other birth control.

## 2024-12-12 ENCOUNTER — Ambulatory Visit: Admitting: Radiology
